# Patient Record
Sex: Female | Born: 1945 | Race: White | Hispanic: No | Marital: Married | Smoking: Former smoker
Health system: Southern US, Community
[De-identification: ages and names within clinical notes are randomized; demographics above are authoritative.]

## PROBLEM LIST (undated history)

## (undated) DIAGNOSIS — M1990 Unspecified osteoarthritis, unspecified site: Secondary | ICD-10-CM

## (undated) DIAGNOSIS — T753XXA Motion sickness, initial encounter: Secondary | ICD-10-CM

## (undated) HISTORY — PX: COLONOSCOPY: SHX174

## (undated) HISTORY — PX: EYE SURGERY: SHX253

## (undated) HISTORY — PX: CATARACT EXTRACTION W/ INTRAOCULAR LENS IMPLANT: SHX1309

## (undated) HISTORY — PX: APPENDECTOMY: SHX54

---

## 2006-01-07 ENCOUNTER — Ambulatory Visit: Payer: Self-pay | Admitting: Obstetrics and Gynecology

## 2007-08-19 ENCOUNTER — Ambulatory Visit: Payer: Self-pay | Admitting: Unknown Physician Specialty

## 2010-01-10 ENCOUNTER — Emergency Department: Payer: Self-pay | Admitting: Emergency Medicine

## 2011-02-22 ENCOUNTER — Ambulatory Visit: Payer: Self-pay | Admitting: Ophthalmology

## 2011-02-27 ENCOUNTER — Ambulatory Visit: Payer: Self-pay | Admitting: Ophthalmology

## 2012-05-11 ENCOUNTER — Ambulatory Visit: Payer: Self-pay | Admitting: Ophthalmology

## 2012-05-15 ENCOUNTER — Ambulatory Visit: Payer: Self-pay | Admitting: Ophthalmology

## 2012-12-28 ENCOUNTER — Ambulatory Visit: Payer: Self-pay | Admitting: Unknown Physician Specialty

## 2014-11-29 NOTE — Op Note (Signed)
PATIENT NAME:  Lacey LeaverLEXANDER, Jenessa S MR#:  [id number] DATE OF BIRTH:  Sep 29, 1945  DATE OF PROCEDURE:  05/11/2012  PREOPERATIVE DIAGNOSIS:  Cataract, left eye.    POSTOPERATIVE DIAGNOSIS:  Cataract, left eye.  PROCEDURE PERFORMED:  Extracapsular cataract extraction using phacoemulsification with placement of an Alcon [id number], 21.5-diopter posterior chamber lens, serial # [id number].  SURGEON:  Maylon PeppersSteven A. Jaymarie Yeakel, MD  ASSISTANT:  None.  ANESTHESIA:  4% lidocaine and 0.75% Marcaine in a 50/50 mixture with 10 units per mL of Hylenex added, given as a peribulbar.  ANESTHESIOLOGIST:  Dr. Darleene CleaverVan Staveren   COMPLICATIONS:  None.  ESTIMATED BLOOD LOSS:  Less than 1 mL.  DESCRIPTION OF PROCEDURE:  The patient was brought to the operating room and given a peribulbar block.  The patient was then prepped and draped in the usual fashion.  The vertical rectus muscles were imbricated using 5-0 silk sutures.  These sutures were then clamped to the sterile drapes as bridle sutures.  A limbal peritomy was performed extending two clock hours and hemostasis was obtained with cautery.  A partial thickness scleral groove was made at the surgical limbus and dissected anteriorly in a lamellar dissection using an Alcon crescent knife.  The anterior chamber was entered supero-temporally with a Superblade and through the lamellar dissection with a 2.6 mm keratome.  DisCoVisc was used to replace the aqueous and a continuous tear capsulorrhexis was carried out.  Hydrodissection and hydrodelineation were carried out with balanced salt and a 27 gauge canula.  The nucleus was rotated to confirm the effectiveness of the hydrodissection.  Phacoemulsification was carried out using a divide-and-conquer technique.  Total ultrasound time was 1 minute and 9 seconds with an average power of 19.2 percent, CDE 23.89.  Irrigation/aspiration was used to remove the residual cortex.  DisCoVisc was used to inflate the capsule and the  internal incision was enlarged to 3 mm with the crescent knife.  The intraocular lens was folded and inserted into the capsular bag using the AcrySert delivery system.  Irrigation/aspiration was used to remove the residual DisCoVisc.  Miostat was injected into the anterior chamber through the paracentesis track to inflate the anterior chamber and induce miosis.  The wound was checked for leaks and none were found. The conjunctiva was closed with cautery and the bridle sutures were removed.  Two drops of 0.3% Vigamox were placed on the eye.   An eye shield was placed on the eye.  The patient was discharged to the recovery room in good condition.  ____________________________ Maylon PeppersSteven A. Cyra Spader, MD sad:drc D: 05/11/2012 12:37:09 ET T: 05/11/2012 12:44:15 ET JOB#: [id number]  cc: Viviann SpareSteven A. Jerrika Ledlow, MD, <Dictator> Erline LevineSTEVEN A Vaiden Adames MD ELECTRONICALLY SIGNED 05/18/2012 13:51

## 2015-06-07 ENCOUNTER — Other Ambulatory Visit: Payer: Self-pay | Admitting: Unknown Physician Specialty

## 2015-06-07 DIAGNOSIS — M25562 Pain in left knee: Secondary | ICD-10-CM

## 2015-06-16 ENCOUNTER — Ambulatory Visit
Admission: RE | Admit: 2015-06-16 | Discharge: 2015-06-16 | Disposition: A | Discharge status: Home / Self Care | Payer: Medicare Other | Source: Ambulatory Visit | Attending: Unknown Physician Specialty | Admitting: Unknown Physician Specialty

## 2015-06-16 DIAGNOSIS — S83232A Complex tear of medial meniscus, current injury, left knee, initial encounter: Secondary | ICD-10-CM | POA: Diagnosis not present

## 2015-06-16 DIAGNOSIS — M2242 Chondromalacia patellae, left knee: Secondary | ICD-10-CM | POA: Diagnosis not present

## 2015-06-16 DIAGNOSIS — M25562 Pain in left knee: Secondary | ICD-10-CM | POA: Diagnosis present

## 2015-06-16 DIAGNOSIS — X58XXXA Exposure to other specified factors, initial encounter: Secondary | ICD-10-CM | POA: Diagnosis not present

## 2017-02-14 IMAGING — MR MR KNEE*L* W/O CM
6 series · 40 of 40 positions shown · non-contrast
Comparison: None.

CLINICAL DATA: Chronic left knee pain.

EXAM:
MRI OF THE LEFT KNEE WITHOUT CONTRAST
TECHNIQUE: Multiplanar, multisequence MR imaging of the knee was performed. No
intravenous contrast was administered.

[Series 3: PD fat-sat · axial · 3.0mm · 0.31mm/px · z∈[-46,+43]mm · 6 of 28 slices shown (1 of 4)]
[im 1/28]
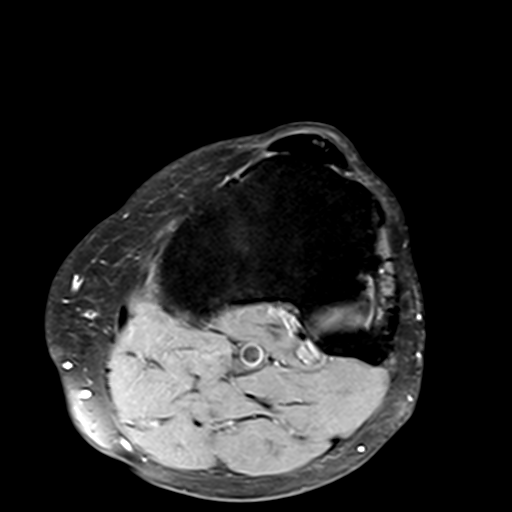
[im 6/28]
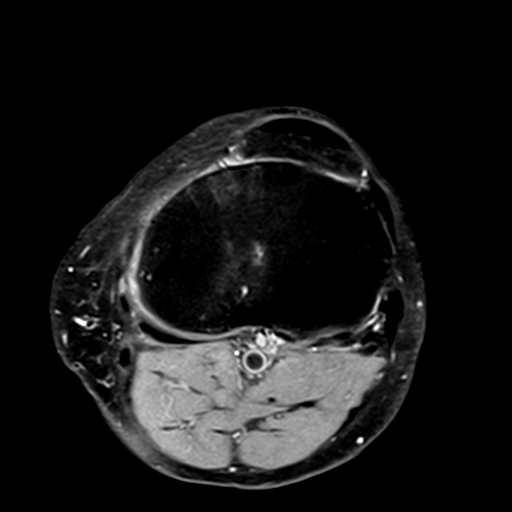
[im 11/28]
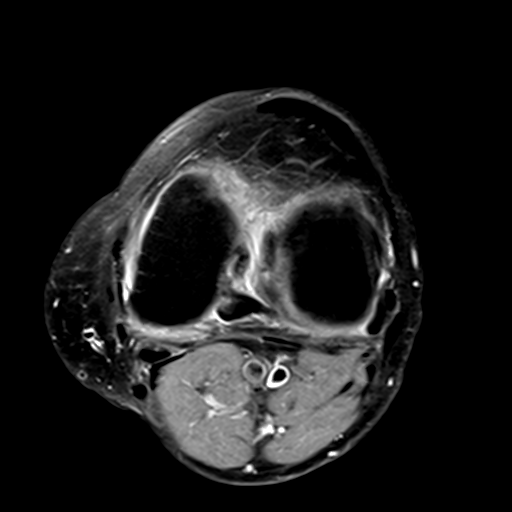
[im 17/28]
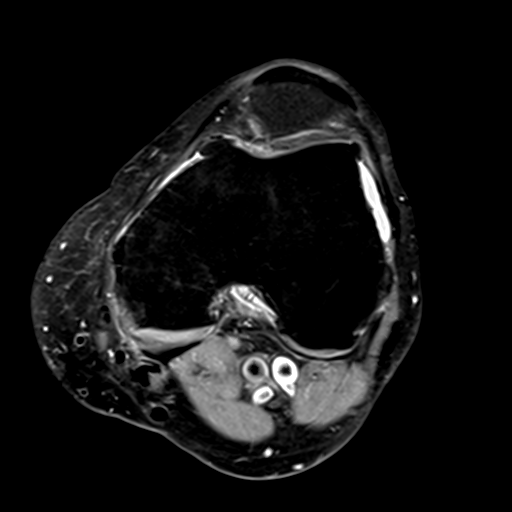
[im 22/28]
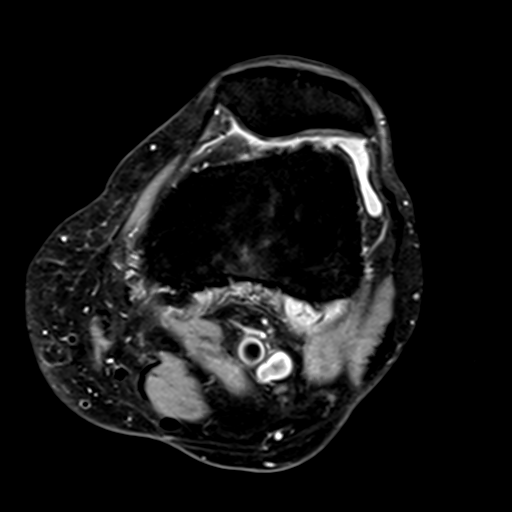
[im 28/28]
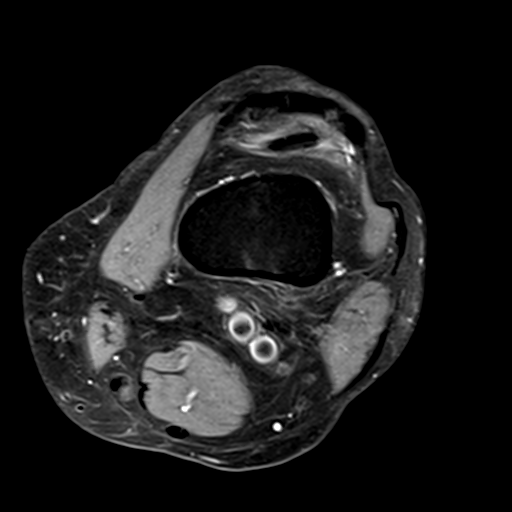

[Series 4: T1 · coronal · 3.0mm · 0.50mm/px · 8 of 31 slices shown]
[im 1/31]
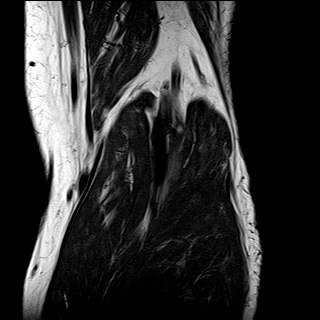
[im 5/31]
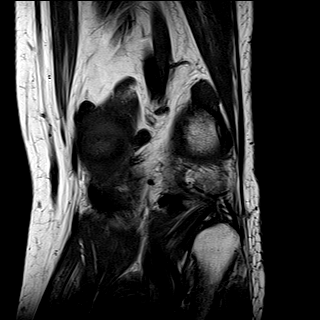
[im 9/31]
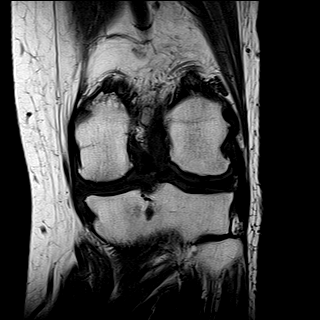
[im 13/31]
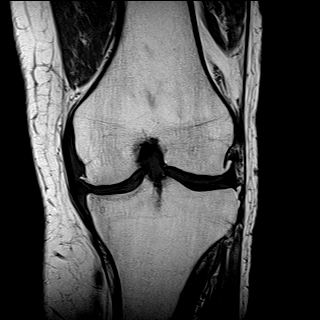
[im 18/31]
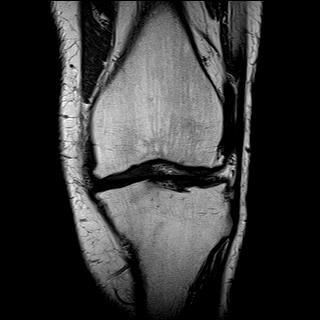
[im 22/31]
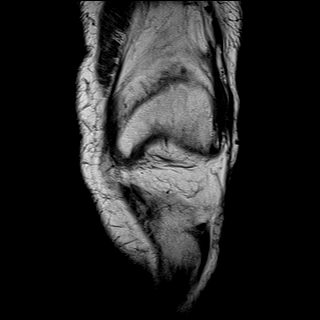
[im 26/31]
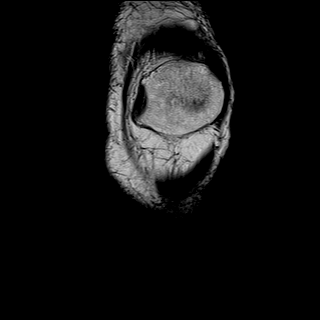
[im 31/31]
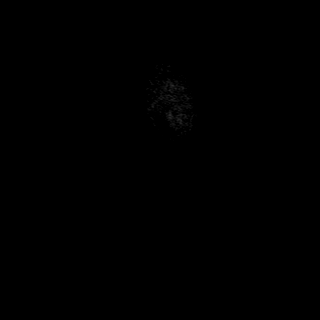

[Series 5: T2 fat-sat · coronal · 3.0mm · 0.50mm/px · 8 of 31 slices shown]
[im 1/31]
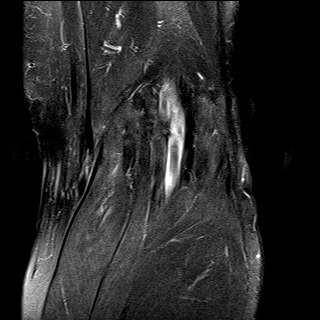
[im 5/31]
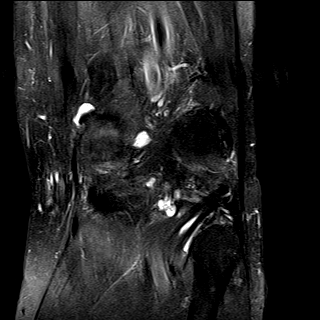
[im 9/31]
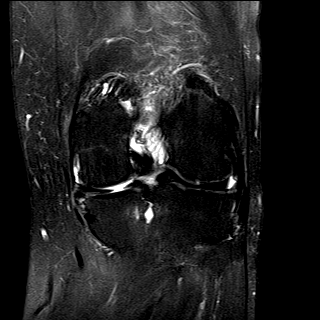
[im 13/31]
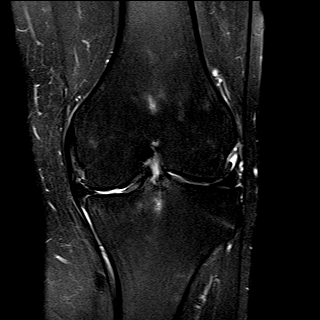
[im 18/31]
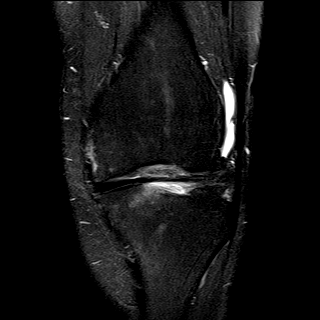
[im 22/31]
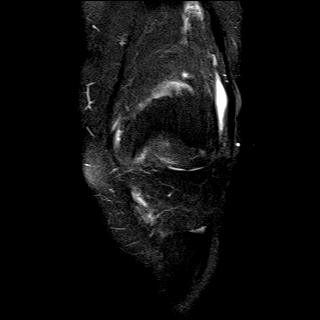
[im 26/31]
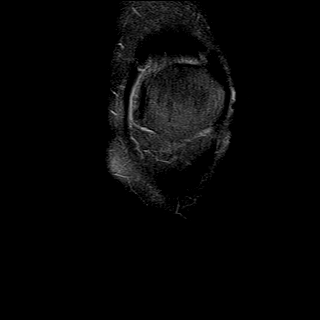
[im 31/31]
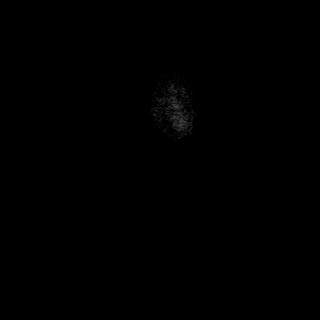

[Series 6: PD fat-sat · coronal · 3.0mm · 0.62mm/px · 8 of 31 slices shown (2 of 4)]
[im 1/31]
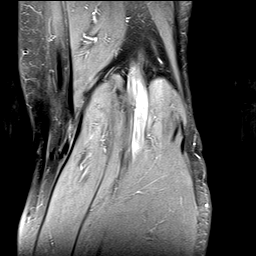
[im 5/31]
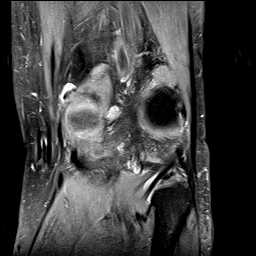
[im 9/31]
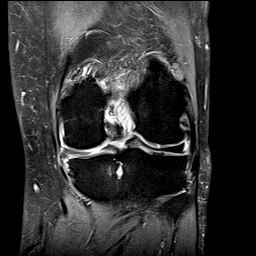
[im 13/31]
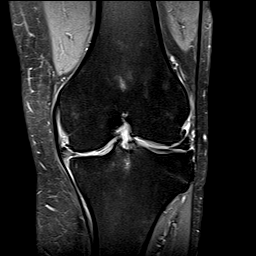
[im 18/31]
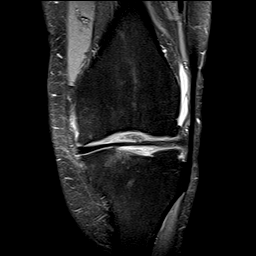
[im 22/31]
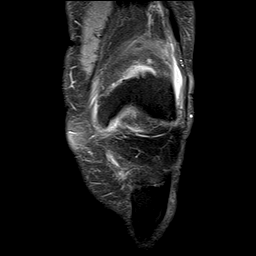
[im 26/31]
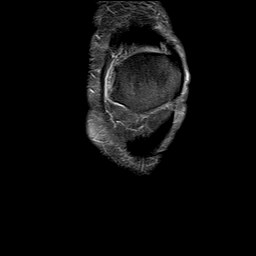
[im 31/31]
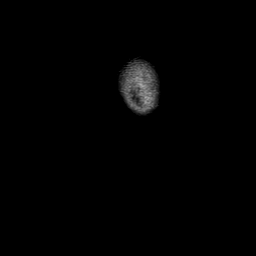

[Series 7: PD fat-sat · sagittal · 3.0mm · 0.62mm/px · 8 of 30 slices shown (3 of 4)]
[im 1/30]
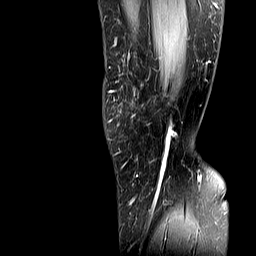
[im 5/30]
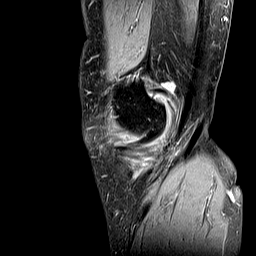
[im 9/30]
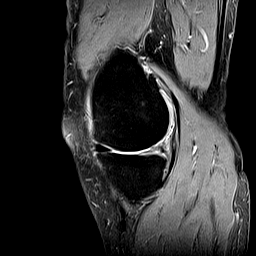
[im 13/30]
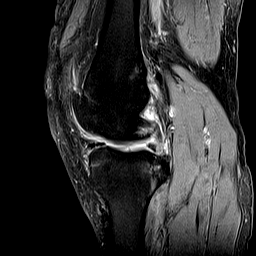
[im 17/30]
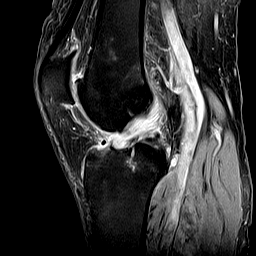
[im 21/30]
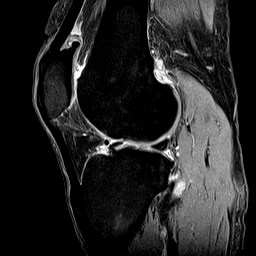
[im 25/30]
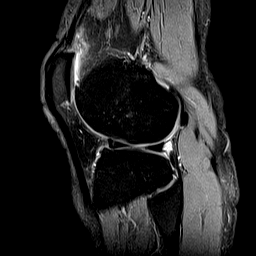
[im 30/30]
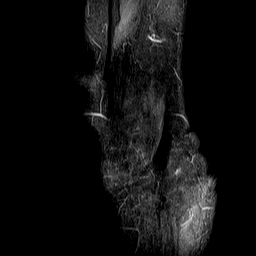

[Series 8: PD fat-sat · oblique · 2.0mm · 0.62mm/px · 2 of 9 slices shown (4 of 4)]
[im 1/9]
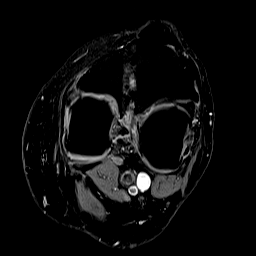
[im 9/9]
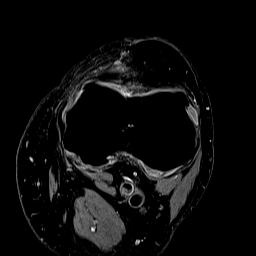

[40 of 40 positions shown; findings below may reference images not displayed]

FINDINGS: MENISCI

Medial meniscus: There is a complex tear of the midbody and
posterior horn of the medial meniscus with horizontal and
longitudinal and radial components to the tear with a deep almost
complete radial tear of the root of the posterior horn. This is best
seen on image 21 of series 3.

Lateral meniscus:  Normal.

LIGAMENTS

Cruciates:  Intact.  Diffuse mucoid degeneration of the ACL.

Collaterals:  Normal.

CARTILAGE

Patellofemoral: Diffuse thinning of the articular cartilage. Tiny
area of grade 4 chondromalacia of the apex of the patella.

Medial: Full-thickness cartilage loss in the central portion of the
condyle and tibial plateau with joint space narrowing and peripheral
subluxation of the meniscus.

Lateral: Focal fissure in the posterior central aspect of the
femoral condyle.

Joint:  Minimal joint effusion.

Popliteal Fossa:  Tiny ganglion cyst at the posterior medial corner.

Extensor Mechanism:  Normal.

Bones:  Tricompartmental marginal osteophytes.
IMPRESSION: 1. Complex tear of the midbody and posterior horn of the medial
meniscus with central full-thickness cartilage loss in the medial
compartment.
2. Diffuse slight chondromalacia in the patellofemoral compartment.

## 2019-07-06 NOTE — Discharge Instructions (Signed)

## 2019-07-07 ENCOUNTER — Encounter: Payer: Self-pay | Admitting: *Deleted

## 2019-07-07 ENCOUNTER — Other Ambulatory Visit: Payer: Self-pay

## 2019-07-09 ENCOUNTER — Other Ambulatory Visit
Admission: RE | Admit: 2019-07-09 | Discharge: 2019-07-09 | Disposition: A | Discharge status: Home / Self Care | Payer: Medicare Other | Source: Ambulatory Visit | Attending: Ophthalmology | Admitting: Ophthalmology

## 2019-07-09 ENCOUNTER — Other Ambulatory Visit: Payer: Self-pay

## 2019-07-09 DIAGNOSIS — Z01812 Encounter for preprocedural laboratory examination: Secondary | ICD-10-CM | POA: Diagnosis present

## 2019-07-09 DIAGNOSIS — Z20828 Contact with and (suspected) exposure to other viral communicable diseases: Secondary | ICD-10-CM | POA: Diagnosis not present

## 2019-07-10 LAB — SARS CORONAVIRUS 2 (TAT 6-24 HRS): SARS Coronavirus 2: NEGATIVE

## 2019-07-12 NOTE — Anesthesia Preprocedure Evaluation (Addendum)
Anesthesia Evaluation  Patient identified by MRN, date of birth, ID band Patient awake    Reviewed: Allergy & Precautions, NPO status , Patient's Chart, lab work & pertinent test results  History of Anesthesia Complications Negative for: history of anesthetic complications  Airway Mallampati: II  TM Distance: >3 FB Neck ROM: Full    Dental   Pulmonary former smoker,    breath sounds clear to auscultation       Cardiovascular (-) angina(-) DOE  Rhythm:Regular Rate:Normal     Neuro/Psych    GI/Hepatic neg GERD  ,  Endo/Other    Renal/GU      Musculoskeletal  (+) Arthritis ,   Abdominal   Peds  Hematology   Anesthesia Other Findings   Reproductive/Obstetrics                            Anesthesia Physical Anesthesia Plan  ASA: I  Anesthesia Plan: MAC   Post-op Pain Management:    Induction: Intravenous  PONV Risk Score and Plan: 2 and TIVA, Midazolam and Treatment may vary due to age or medical condition  Airway Management Planned: Nasal Cannula  Additional Equipment:   Intra-op Plan:   Post-operative Plan:   Informed Consent: I have reviewed the patients History and Physical, chart, labs and discussed the procedure including the risks, benefits and alternatives for the proposed anesthesia with the patient or authorized representative who has indicated his/her understanding and acceptance.       Plan Discussed with: CRNA and Anesthesiologist  Anesthesia Plan Comments:         Anesthesia Quick Evaluation

## 2019-07-13 ENCOUNTER — Other Ambulatory Visit: Payer: Self-pay

## 2019-07-13 ENCOUNTER — Ambulatory Visit
Admission: RE | Admit: 2019-07-13 | Discharge: 2019-07-13 | Disposition: A | Discharge status: Home / Self Care | Payer: Medicare Other | Attending: Ophthalmology | Admitting: Ophthalmology

## 2019-07-13 ENCOUNTER — Encounter
Admission: RE | Disposition: A | Discharge status: Home / Self Care | Payer: Self-pay | Source: Home / Self Care | Attending: Ophthalmology

## 2019-07-13 ENCOUNTER — Ambulatory Visit: Payer: Medicare Other | Admitting: Anesthesiology

## 2019-07-13 DIAGNOSIS — Z91040 Latex allergy status: Secondary | ICD-10-CM | POA: Insufficient documentation

## 2019-07-13 DIAGNOSIS — Z87891 Personal history of nicotine dependence: Secondary | ICD-10-CM | POA: Diagnosis not present

## 2019-07-13 DIAGNOSIS — H2511 Age-related nuclear cataract, right eye: Secondary | ICD-10-CM | POA: Diagnosis present

## 2019-07-13 DIAGNOSIS — Z85828 Personal history of other malignant neoplasm of skin: Secondary | ICD-10-CM | POA: Diagnosis not present

## 2019-07-13 DIAGNOSIS — Z880 Allergy status to penicillin: Secondary | ICD-10-CM | POA: Insufficient documentation

## 2019-07-13 HISTORY — DX: Unspecified osteoarthritis, unspecified site: M19.90

## 2019-07-13 HISTORY — DX: Motion sickness, initial encounter: T75.3XXA

## 2019-07-13 HISTORY — PX: CATARACT EXTRACTION W/PHACO: SHX586

## 2019-07-13 SURGERY — PHACOEMULSIFICATION, CATARACT, WITH IOL INSERTION
Anesthesia: Monitor Anesthesia Care | Site: Eye | Laterality: Right

## 2019-07-13 MED ORDER — ACETAMINOPHEN 10 MG/ML IV SOLN: 1000.0000 mg | Freq: Once | INTRAVENOUS | Status: DC | PRN

## 2019-07-13 MED ORDER — ONDANSETRON HCL 4 MG/2ML IJ SOLN: 4.0000 mg | Freq: Once | INTRAMUSCULAR | Status: DC | PRN

## 2019-07-13 MED ORDER — TETRACAINE HCL 0.5 % OP SOLN
1.0000 [drp] | OPHTHALMIC | Status: DC | PRN
  Administered 2019-07-13 (×3): 1 [drp] via OPHTHALMIC

## 2019-07-13 MED ORDER — LIDOCAINE HCL (PF) 2 % IJ SOLN
INTRAOCULAR | Status: DC | PRN
  Administered 2019-07-13: 1 mL

## 2019-07-13 MED ORDER — EPINEPHRINE PF 1 MG/ML IJ SOLN
INTRAOCULAR | Status: DC | PRN
  Administered 2019-07-13: 97 mL via OPHTHALMIC

## 2019-07-13 MED ORDER — NA CHONDROIT SULF-NA HYALURON 40-17 MG/ML IO SOLN
INTRAOCULAR | Status: DC | PRN
  Administered 2019-07-13: 1 mL via INTRAOCULAR

## 2019-07-13 MED ORDER — ARMC OPHTHALMIC DILATING DROPS
1.0000 "application " | OPHTHALMIC | Status: DC | PRN
  Administered 2019-07-13 (×3): 1 via OPHTHALMIC

## 2019-07-13 MED ORDER — FENTANYL CITRATE (PF) 100 MCG/2ML IJ SOLN
INTRAMUSCULAR | Status: DC | PRN
  Administered 2019-07-13 (×2): 50 ug via INTRAVENOUS

## 2019-07-13 MED ORDER — MIDAZOLAM HCL 2 MG/2ML IJ SOLN
INTRAMUSCULAR | Status: DC | PRN
  Administered 2019-07-13: 2 mg via INTRAVENOUS

## 2019-07-13 MED ORDER — MOXIFLOXACIN HCL 0.5 % OP SOLN
OPHTHALMIC | Status: DC | PRN
  Administered 2019-07-13: 0.2 mL via OPHTHALMIC

## 2019-07-13 MED ORDER — BRIMONIDINE TARTRATE-TIMOLOL 0.2-0.5 % OP SOLN
OPHTHALMIC | Status: DC | PRN
  Administered 2019-07-13: 1 [drp] via OPHTHALMIC

## 2019-07-13 SURGICAL SUPPLY — 19 items
CANNULA ANT/CHMB 27GA (MISCELLANEOUS) ×6 IMPLANT
GLOVE SURG LX 8.0 MICRO (GLOVE) ×2
GLOVE SURG LX STRL 8.0 MICRO (GLOVE) ×1 IMPLANT
GLOVE SURG TRIUMPH 8.0 PF LTX (GLOVE) ×3 IMPLANT
GOWN STRL REUS W/ TWL LRG LVL3 (GOWN DISPOSABLE) ×2 IMPLANT
GOWN STRL REUS W/TWL LRG LVL3 (GOWN DISPOSABLE) ×4
LENS IOL ACRSF IQ ULTRA 24.0 (Intraocular Lens) ×1 IMPLANT
LENS IOL ACRYSOF IQ 24.0 (Intraocular Lens) ×3 IMPLANT
MARKER SKIN DUAL TIP RULER LAB (MISCELLANEOUS) ×3 IMPLANT
NDL RETROBULBAR .5 NSTRL (NEEDLE) ×3 IMPLANT
NEEDLE FILTER BLUNT 18X 1/2SAF (NEEDLE) ×2
NEEDLE FILTER BLUNT 18X1 1/2 (NEEDLE) ×1 IMPLANT
PACK EYE AFTER SURG (MISCELLANEOUS) ×3 IMPLANT
PACK OPTHALMIC (MISCELLANEOUS) ×3 IMPLANT
PACK PORFILIO (MISCELLANEOUS) ×3 IMPLANT
SYR 3ML LL SCALE MARK (SYRINGE) ×3 IMPLANT
SYR TB 1ML LUER SLIP (SYRINGE) ×3 IMPLANT
WATER STERILE IRR 250ML POUR (IV SOLUTION) ×3 IMPLANT
WIPE NON LINTING 3.25X3.25 (MISCELLANEOUS) ×3 IMPLANT

## 2019-07-13 NOTE — Anesthesia Procedure Notes (Signed)
Procedure Name: MAC Date/Time: 07/13/2019 12:27 PM Performed by: Izetta Dakin, CRNA Pre-anesthesia Checklist: Patient identified, Emergency Drugs available, Suction available, Patient being monitored and Timeout performed Patient Re-evaluated:Patient Re-evaluated prior to induction Oxygen Delivery Method: Nasal cannula

## 2019-07-13 NOTE — Anesthesia Postprocedure Evaluation (Signed)
Anesthesia Post Note  Patient: Lacey Hensley  Procedure(s) Performed: CATARACT EXTRACTION PHACO AND INTRAOCULAR LENS PLACEMENT (IOC) RIGHT 6.35  01:03.1 (Right Eye)     Patient location during evaluation: PACU Anesthesia Type: MAC Level of consciousness: awake and alert Pain management: pain level controlled Vital Signs Assessment: post-procedure vital signs reviewed and stable Respiratory status: spontaneous breathing, nonlabored ventilation, respiratory function stable and patient connected to nasal cannula oxygen Cardiovascular status: stable and blood pressure returned to baseline Postop Assessment: no apparent nausea or vomiting Anesthetic complications: no    Aby Gessel A  Eliza Grissinger

## 2019-07-13 NOTE — H&P (Signed)
All labs reviewed. Abnormal studies sent to patients PCP when indicated.  Previous H&P reviewed, patient examined, there are NO CHANGES.  Lacey Hagner Porfilio12/1/202012:07 PM

## 2019-07-13 NOTE — Transfer of Care (Signed)
Immediate Anesthesia Transfer of Care Note  Patient: Lacey Hensley  Procedure(s) Performed: CATARACT EXTRACTION PHACO AND INTRAOCULAR LENS PLACEMENT (IOC) RIGHT 6.35  01:03.1 (Right Eye)  Patient Location: PACU  Anesthesia Type: MAC  Level of Consciousness: awake, alert  and patient cooperative  Airway and Oxygen Therapy: Patient Spontanous Breathing and Patient connected to supplemental oxygen  Post-op Assessment: Post-op Vital signs reviewed, Patient's Cardiovascular Status Stable, Respiratory Function Stable, Patent Airway and No signs of Nausea or vomiting  Post-op Vital Signs: Reviewed and stable  Complications: No apparent anesthesia complications

## 2019-07-14 ENCOUNTER — Encounter: Payer: Self-pay | Admitting: Ophthalmology

## 2019-07-14 NOTE — Op Note (Signed)
PREOPERATIVE DIAGNOSIS:  Nuclear sclerotic cataract of the right eye.   POSTOPERATIVE DIAGNOSIS:  H25.11 Cataract   OPERATIVE PROCEDURE:@   SURGEON:  Birder Robson, MD.   ANESTHESIA:  Anesthesiologist: Heniser, Fredric Dine, MD CRNA: Izetta Dakin, CRNA  1.      Managed anesthesia care. 2.      0.76ml of Shugarcaine was instilled in the eye following the paracentesis.   COMPLICATIONS:  None.   TECHNIQUE:   Stop and chop   DESCRIPTION OF PROCEDURE:  The patient was examined and consented in the preoperative holding area where the aforementioned topical anesthesia was applied to the right eye and then brought back to the Operating Room where the right eye was prepped and draped in the usual sterile ophthalmic fashion and a lid speculum was placed. A paracentesis was created with the side port blade and the anterior chamber was filled with viscoelastic. A near clear corneal incision was performed with the steel keratome. A continuous curvilinear capsulorrhexis was performed with a cystotome followed by the capsulorrhexis forceps. Hydrodissection and hydrodelineation were carried out with BSS on a blunt cannula. The lens was removed in a stop and chop  technique and the remaining cortical material was removed with the irrigation-aspiration handpiece. The capsular bag was inflated with viscoelastic and the Technis [id number]  lens was placed in the capsular bag without complication. The remaining viscoelastic was removed from the eye with the irrigation-aspiration handpiece. The wounds were hydrated. The anterior chamber was flushed with BSS and the eye was inflated to physiologic pressure. 0.70ml of Vigamox was placed in the anterior chamber. The wounds were found to be water tight. The eye was dressed with Combigan. The patient was given protective glasses to wear throughout the day and a shield with which to sleep tonight. The patient was also given drops with which to begin a drop regimen today and  will follow-up with me in one day. Implant Name Type Inv. Item Serial No. Manufacturer Lot No. LRB No. Used Action  LENS IOL ACRYSOF IQ 24.0 - [id number] Intraocular Lens LENS IOL ACRYSOF IQ 24.0 [id number] ALCON  Right 1 Implanted   Procedure(s) with comments: CATARACT EXTRACTION PHACO AND INTRAOCULAR LENS PLACEMENT (IOC) RIGHT 6.35  01:03.1 (Right) - Latex  Electronically signed: Birder Robson 07/14/2019 4:54 PM

## 2022-02-18 ENCOUNTER — Other Ambulatory Visit: Payer: Self-pay | Admitting: Surgery

## 2022-02-18 DIAGNOSIS — M9973 Connective tissue and disc stenosis of intervertebral foramina of lumbar region: Secondary | ICD-10-CM

## 2022-02-18 DIAGNOSIS — M47816 Spondylosis without myelopathy or radiculopathy, lumbar region: Secondary | ICD-10-CM

## 2022-04-01 ENCOUNTER — Ambulatory Visit
Admission: RE | Admit: 2022-04-01 | Discharge: 2022-04-01 | Disposition: A | Discharge status: Home / Self Care | Payer: Medicare Other | Source: Ambulatory Visit | Attending: Surgery | Admitting: Surgery

## 2022-04-01 DIAGNOSIS — M47816 Spondylosis without myelopathy or radiculopathy, lumbar region: Secondary | ICD-10-CM | POA: Diagnosis present

## 2022-04-01 DIAGNOSIS — M9973 Connective tissue and disc stenosis of intervertebral foramina of lumbar region: Secondary | ICD-10-CM | POA: Diagnosis present
